# Patient Record
Sex: Male | Born: 1960 | Race: White | Hispanic: No | Marital: Single | Smoking: Never smoker
Health system: Southern US, Community
[De-identification: ages and names within clinical notes are randomized; demographics above are authoritative.]

## PROBLEM LIST (undated history)

## (undated) DIAGNOSIS — I2510 Atherosclerotic heart disease of native coronary artery without angina pectoris: Secondary | ICD-10-CM

## (undated) DIAGNOSIS — E119 Type 2 diabetes mellitus without complications: Secondary | ICD-10-CM

## (undated) HISTORY — PX: CORONARY ANGIOPLASTY WITH STENT PLACEMENT: SHX49

## (undated) HISTORY — PX: APPENDECTOMY: SHX54

---

## 2004-07-09 ENCOUNTER — Emergency Department: Payer: Self-pay | Admitting: Unknown Physician Specialty

## 2004-07-26 ENCOUNTER — Encounter: Payer: Self-pay | Admitting: Internal Medicine

## 2004-08-15 ENCOUNTER — Encounter: Payer: Self-pay | Admitting: Internal Medicine

## 2004-08-16 ENCOUNTER — Ambulatory Visit: Payer: Self-pay | Admitting: Internal Medicine

## 2004-09-15 ENCOUNTER — Encounter: Payer: Self-pay | Admitting: Internal Medicine

## 2004-10-15 ENCOUNTER — Encounter: Payer: Self-pay | Admitting: Internal Medicine

## 2006-07-17 ENCOUNTER — Ambulatory Visit: Payer: Self-pay

## 2006-08-20 ENCOUNTER — Ambulatory Visit: Payer: Self-pay

## 2007-10-03 ENCOUNTER — Ambulatory Visit: Payer: Self-pay

## 2007-10-08 ENCOUNTER — Ambulatory Visit: Payer: Self-pay | Admitting: Cardiology

## 2009-08-30 ENCOUNTER — Ambulatory Visit: Payer: Self-pay | Admitting: Unknown Physician Specialty

## 2011-08-09 ENCOUNTER — Emergency Department: Payer: Self-pay | Admitting: Emergency Medicine

## 2011-08-09 LAB — URINALYSIS, COMPLETE
Bacteria: NONE SEEN
Bilirubin,UR: NEGATIVE
Blood: NEGATIVE
Glucose,UR: NEGATIVE mg/dL (ref 0–75)
Ketone: NEGATIVE
Leukocyte Esterase: NEGATIVE
Nitrite: NEGATIVE
Ph: 6 (ref 4.5–8.0)
RBC,UR: <1 /HPF (ref 0–5)
Specific Gravity: 1.018 (ref 1.003–1.030)
WBC UR: <1 /HPF (ref 0–5)

## 2011-08-09 LAB — COMPREHENSIVE METABOLIC PANEL
Albumin: 3.6 g/dL (ref 3.4–5.0)
BUN: 12 mg/dL (ref 7–18)
Chloride: 104 mmol/L (ref 98–107)
Creatinine: 0.95 mg/dL (ref 0.60–1.30)
EGFR (African American): >60
EGFR (Non-African Amer.): >60
Glucose: 104 mg/dL — ABNORMAL HIGH (ref 65–99)
Osmolality: 274 (ref 275–301)
Potassium: 3.2 mmol/L — ABNORMAL LOW (ref 3.5–5.1)
SGOT(AST): 23 U/L (ref 15–37)
SGPT (ALT): 43 U/L
Total Protein: 7.7 g/dL (ref 6.4–8.2)

## 2011-08-09 LAB — CBC
HGB: 14.3 g/dL (ref 13.0–18.0)
MCH: 25.4 pg — ABNORMAL LOW (ref 26.0–34.0)
MCHC: 33.1 g/dL (ref 32.0–36.0)
Platelet: 250 10*3/uL (ref 150–440)
RBC: 5.63 10*6/uL (ref 4.40–5.90)
WBC: 10.5 10*3/uL (ref 3.8–10.6)

## 2011-08-09 LAB — CK TOTAL AND CKMB (NOT AT ARMC)
CK, Total: 87 U/L (ref 35–232)
CK-MB: 0.9 ng/mL (ref 0.5–3.6)

## 2011-08-10 ENCOUNTER — Emergency Department: Payer: Self-pay | Admitting: Emergency Medicine

## 2011-08-10 LAB — COMPREHENSIVE METABOLIC PANEL
Albumin: 3.6 g/dL (ref 3.4–5.0)
Alkaline Phosphatase: 79 U/L (ref 50–136)
Anion Gap: 9 (ref 7–16)
Calcium, Total: 8.6 mg/dL (ref 8.5–10.1)
EGFR (African American): >60
EGFR (Non-African Amer.): >60
Glucose: 92 mg/dL (ref 65–99)
SGOT(AST): 23 U/L (ref 15–37)
Sodium: 140 mmol/L (ref 136–145)

## 2011-08-10 LAB — CBC
HCT: 42.8 % (ref 40.0–52.0)
MCHC: 33.3 g/dL (ref 32.0–36.0)
MCV: 76 fL — ABNORMAL LOW (ref 80–100)
RBC: 5.61 10*6/uL (ref 4.40–5.90)

## 2011-08-10 LAB — URINALYSIS, COMPLETE
Bacteria: NONE SEEN
Blood: NEGATIVE
Ketone: NEGATIVE
Nitrite: NEGATIVE
Ph: 6 (ref 4.5–8.0)
WBC UR: 1 /HPF (ref 0–5)

## 2011-11-18 ENCOUNTER — Emergency Department: Payer: Self-pay | Admitting: Emergency Medicine

## 2011-11-18 LAB — URINALYSIS, COMPLETE
Bilirubin,UR: NEGATIVE
Blood: NEGATIVE
Glucose,UR: NEGATIVE mg/dL (ref 0–75)
Leukocyte Esterase: NEGATIVE
Nitrite: NEGATIVE
Ph: 6 (ref 4.5–8.0)
Protein: NEGATIVE
Specific Gravity: 1.015 (ref 1.003–1.030)
Squamous Epithelial: NONE SEEN

## 2011-11-18 LAB — COMPREHENSIVE METABOLIC PANEL
Albumin: 3.9 g/dL (ref 3.4–5.0)
Alkaline Phosphatase: 94 U/L (ref 50–136)
Bilirubin,Total: 0.7 mg/dL (ref 0.2–1.0)
Calcium, Total: 8.6 mg/dL (ref 8.5–10.1)
Co2: 24 mmol/L (ref 21–32)
Creatinine: 0.96 mg/dL (ref 0.60–1.30)
EGFR (African American): >60
Glucose: 84 mg/dL (ref 65–99)
Osmolality: 278 (ref 275–301)
SGOT(AST): 32 U/L (ref 15–37)
SGPT (ALT): 44 U/L (ref 12–78)
Total Protein: 8.2 g/dL (ref 6.4–8.2)

## 2011-11-18 LAB — CBC
MCH: 24.5 pg — ABNORMAL LOW (ref 26.0–34.0)
MCHC: 32.9 g/dL (ref 32.0–36.0)
RBC: 5.92 10*6/uL — ABNORMAL HIGH (ref 4.40–5.90)
RDW: 15.4 % — ABNORMAL HIGH (ref 11.5–14.5)

## 2011-11-18 LAB — LIPASE, BLOOD: Lipase: 175 U/L (ref 73–393)

## 2012-01-12 ENCOUNTER — Ambulatory Visit: Payer: Self-pay | Admitting: Gastroenterology

## 2012-08-14 ENCOUNTER — Emergency Department: Payer: Self-pay | Admitting: Emergency Medicine

## 2013-05-20 ENCOUNTER — Ambulatory Visit: Payer: Self-pay | Admitting: Specialist

## 2013-06-09 ENCOUNTER — Emergency Department: Payer: Self-pay | Admitting: Emergency Medicine

## 2013-06-09 LAB — CBC
HCT: 40.7 % (ref 40.0–52.0)
HGB: 13.4 g/dL (ref 13.0–18.0)
MCH: 25.8 pg — ABNORMAL LOW (ref 26.0–34.0)
MCHC: 33 g/dL (ref 32.0–36.0)
MCV: 78 fL — ABNORMAL LOW (ref 80–100)
Platelet: 159 10*3/uL (ref 150–440)
RBC: 5.2 10*6/uL (ref 4.40–5.90)
RDW: 14.6 % — AB (ref 11.5–14.5)
WBC: 4.8 10*3/uL (ref 3.8–10.6)

## 2013-06-09 LAB — BASIC METABOLIC PANEL
Anion Gap: 5 — ABNORMAL LOW (ref 7–16)
BUN: 9 mg/dL (ref 7–18)
CALCIUM: 7.7 mg/dL — AB (ref 8.5–10.1)
Chloride: 107 mmol/L (ref 98–107)
Co2: 25 mmol/L (ref 21–32)
Creatinine: 1.03 mg/dL (ref 0.60–1.30)
EGFR (Non-African Amer.): >60
Glucose: 123 mg/dL — ABNORMAL HIGH (ref 65–99)
Osmolality: 274 (ref 275–301)
POTASSIUM: 3.2 mmol/L — AB (ref 3.5–5.1)
Sodium: 137 mmol/L (ref 136–145)

## 2013-06-09 LAB — TROPONIN I

## 2014-10-08 ENCOUNTER — Ambulatory Visit: Payer: BLUE CROSS/BLUE SHIELD | Attending: Internal Medicine

## 2014-10-08 DIAGNOSIS — G4733 Obstructive sleep apnea (adult) (pediatric): Secondary | ICD-10-CM | POA: Insufficient documentation

## 2014-11-12 ENCOUNTER — Ambulatory Visit: Payer: BLUE CROSS/BLUE SHIELD | Attending: Internal Medicine

## 2014-11-12 DIAGNOSIS — G4733 Obstructive sleep apnea (adult) (pediatric): Secondary | ICD-10-CM | POA: Diagnosis not present

## 2015-07-30 IMAGING — CR ORBITS FOR FOREIGN BODY - 2 VIEW
1 series · 2 of 2 positions shown · non-contrast
Comparison: None.

CLINICAL DATA: Metal working/exposure; clearance prior to MRI

EXAM:
ORBITS FOR FOREIGN BODY - 2 VIEW

[Series 1: w waters pa · 0.14mm/px · 2 of 2 slices shown]
[im 1/2]
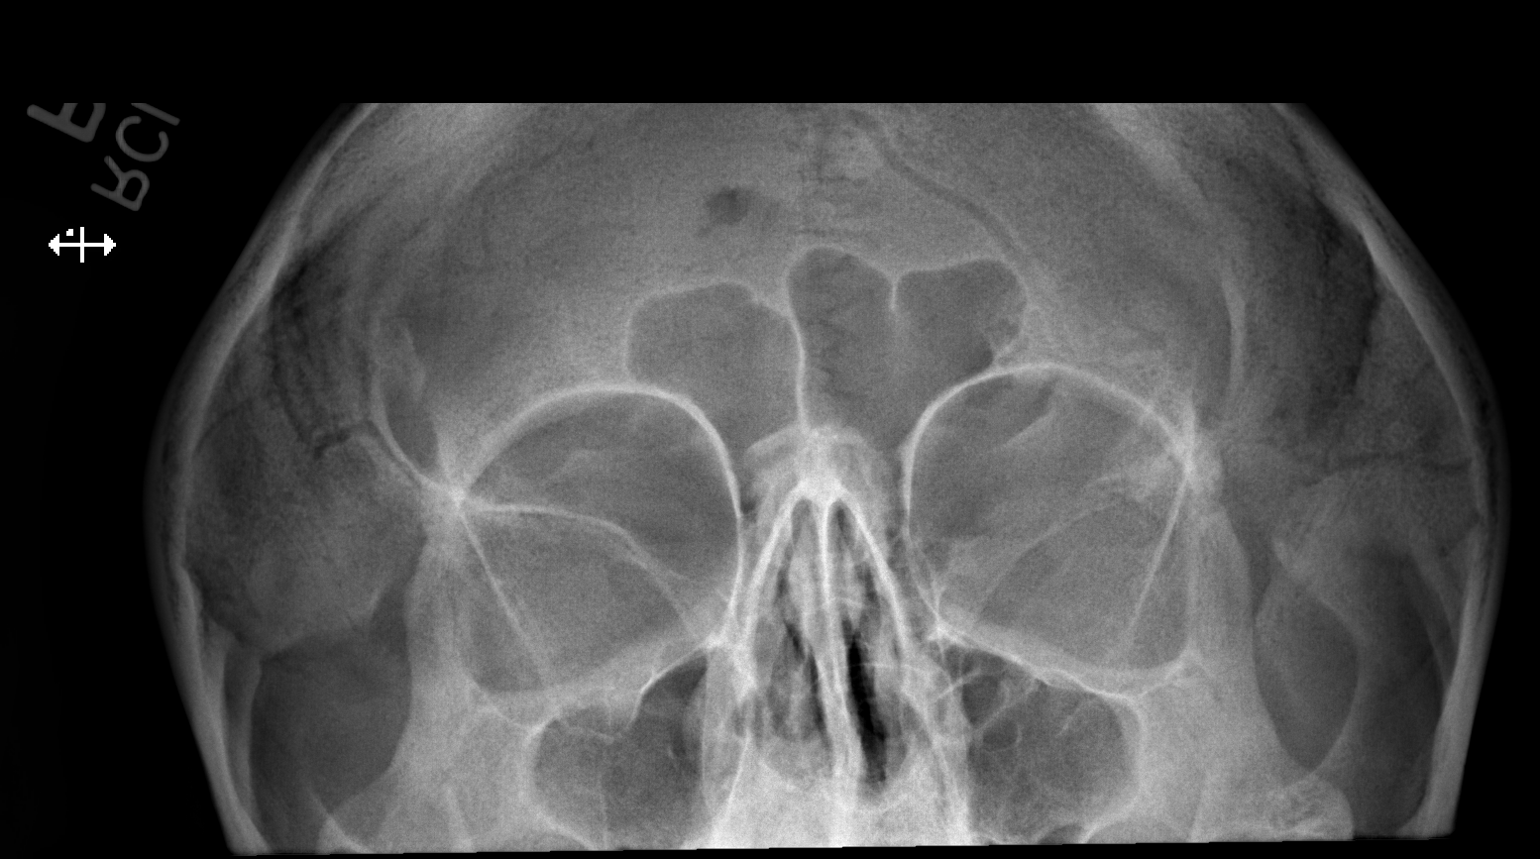
[im 2/2]
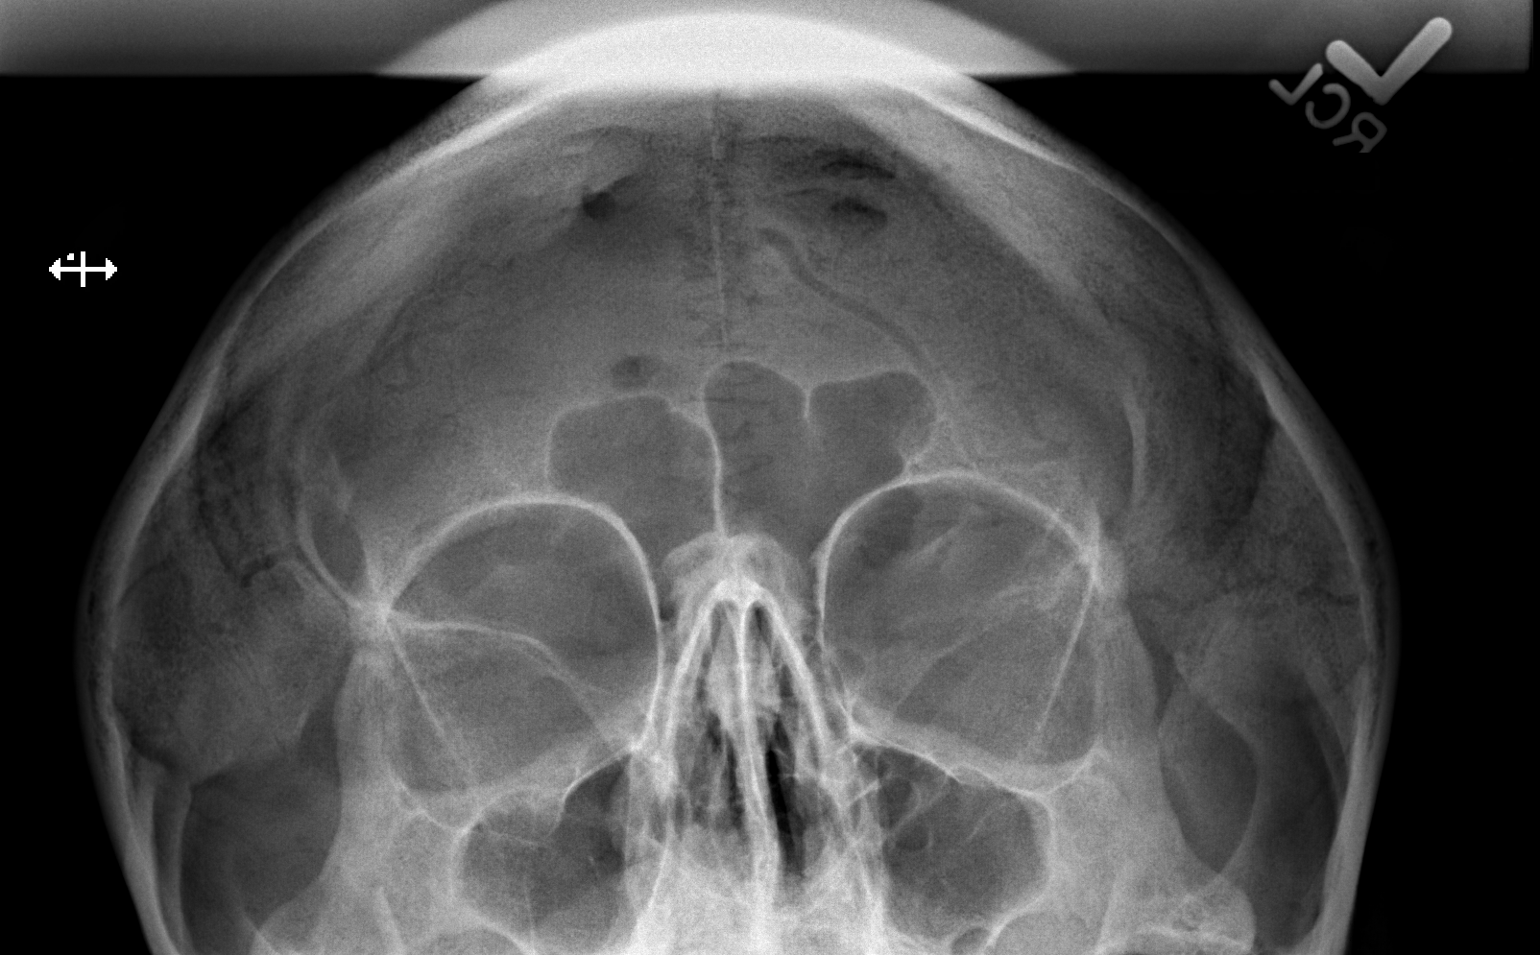

[2 of 2 positions shown; findings below may reference images not displayed]

FINDINGS: There is no evidence of metallic foreign body within the orbits. No
significant bone abnormality identified.
IMPRESSION: No evidence of metallic foreign body within the orbits.

## 2015-12-10 ENCOUNTER — Encounter: Payer: Self-pay | Admitting: *Deleted

## 2015-12-10 ENCOUNTER — Ambulatory Visit
Admission: EM | Admit: 2015-12-10 | Discharge: 2015-12-10 | Disposition: A | Discharge status: Home / Self Care | Payer: Worker's Compensation | Attending: Family Medicine | Admitting: Family Medicine

## 2015-12-10 ENCOUNTER — Ambulatory Visit (INDEPENDENT_AMBULATORY_CARE_PROVIDER_SITE_OTHER): Payer: Worker's Compensation

## 2015-12-10 DIAGNOSIS — S39012A Strain of muscle, fascia and tendon of lower back, initial encounter: Secondary | ICD-10-CM

## 2015-12-10 DIAGNOSIS — M5441 Lumbago with sciatica, right side: Secondary | ICD-10-CM

## 2015-12-10 DIAGNOSIS — M62830 Muscle spasm of back: Secondary | ICD-10-CM

## 2015-12-10 HISTORY — DX: Type 2 diabetes mellitus without complications: E11.9

## 2015-12-10 HISTORY — DX: Atherosclerotic heart disease of native coronary artery without angina pectoris: I25.10

## 2015-12-10 MED ORDER — MELOXICAM 15 MG PO TABS: 15.0000 mg | ORAL_TABLET | Freq: Every day | ORAL | 0 refills | Status: AC

## 2015-12-10 MED ORDER — ORPHENADRINE CITRATE ER 100 MG PO TB12: 100.0000 mg | ORAL_TABLET | Freq: Two times a day (BID) | ORAL | 0 refills | Status: AC

## 2015-12-10 MED ORDER — KETOROLAC TROMETHAMINE 60 MG/2ML IM SOLN
60.0000 mg | Freq: Once | INTRAMUSCULAR | Status: AC
  Administered 2015-12-10: 60 mg via INTRAMUSCULAR

## 2015-12-10 NOTE — ED Provider Notes (Signed)
[facility]    CSN: [id number] Arrival date & time: 12/10/15  1359  First Provider Contact:  First MD Initiated Contact with Patient 12/10/15 1544        History   Chief Complaint Chief Complaint  Patient presents with  . Back Pain    HPI Nathan Owens is a 55 y.o. male.   Patient's here because of back pain. He injured his back on Wednesday. 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 He started feeling some pulling on his back. Yesterday with work has significant mild discomfort told him that he had treat his back at work on Wednesday. Today the pain was much worse and difficult sleeping at night so he came in to be seen and evaluated. He filled out forms yesterday for injury to his back from work and they sent him here for evaluation. 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 Family medical history is not significant. History hypertension hyperlipidemia. He's had appendectomy and coronary angioplasty with stent placement. She is allergic to penicillin and sulfur antibiotics. No pertinent family history significant or relevant to today's visit.   The history is provided by the patient. No language interpreter was used.  Back Pain  Location:  Lumbar spine, gluteal region and sacro-iliac joint Quality:  Burning, aching and cramping Radiates to:  R thigh Pain severity:  Moderate Pain is:  Unable to specify Timing:  Constant Progression:  Worsening Chronicity:  New Context: lifting heavy objects, occupational injury, physical stress and twisting   Context: not emotional stress, not falling, not jumping from heights, not MCA, not MVA, not pedestrian accident, not recent illness and not recent injury   Relieved by:  Nothing Worsened by:  Movement, sitting, twisting and standing Ineffective treatments:  None tried Associated symptoms: no abdominal pain, no abdominal  swelling, no fever and no headaches   Risk factors: lack of exercise, obesity and recent surgery   Risk factors: no hx of cancer, no hx of osteoporosis, no steroid use and no vascular disease     Past Medical History:  Diagnosis Date  . Coronary artery disease   . Diabetes mellitus without complication (HCC)     There are no active problems to display for this patient.   Past Surgical History:  Procedure Laterality Date  . APPENDECTOMY    . CORONARY ANGIOPLASTY WITH STENT PLACEMENT      OB History    No data available       Home Medications    Prior to Admission medications   Medication Sig Start Date End Date Taking? Authorizing Provider  aspirin EC 81 MG tablet Take 81 mg by mouth daily.   Yes Historical Provider, MD  atorvastatin (LIPITOR) 20 MG tablet Take 20 mg by mouth daily.   Yes Historical Provider, MD  clopidogrel (PLAVIX) 75 MG tablet Take 75 mg by mouth daily.   Yes Historical Provider, MD  esomeprazole (NEXIUM) 20 MG packet Take 20 mg by mouth daily before breakfast.   Yes Historical Provider, MD  Liraglutide (VICTOZA [place]) Inject into the skin.   Yes Historical Provider, MD  metoprolol succinate (TOPROL XL) 50 MG 24 hr tablet Take 50 mg by mouth daily. Take with or immediately following a meal.   Yes Historical Provider, MD  ramipril (ALTACE) 10 MG capsule Take 10 mg by mouth daily.   Yes Historical Provider, MD  meloxicam (MOBIC) 15 MG tablet Take 1 tablet (15 mg total) by mouth daily. 12/10/15   Hassan Rowan, MD  orphenadrine (NORFLEX) 100 MG tablet Take 1 tablet (100 mg total) by mouth 2 (two) times daily. 12/10/15   Hassan Rowan, MD    Family History History reviewed. No pertinent family history.  Social History Social History  Substance Use Topics  . Smoking status: Never Smoker  . Smokeless tobacco: Never Used  . Alcohol use No     Allergies   Penicillins and Sulfa antibiotics   Review of Systems Review of Systems  Constitutional: Negative for  fever.  Gastrointestinal: Negative for abdominal pain.  Musculoskeletal: Positive for back pain.  Neurological: Negative for headaches.  All other systems reviewed and are negative.    Physical Exam Triage Vital Signs ED Triage Vitals  Enc Vitals Group     BP 12/10/15 1435 (!) 152/82     Pulse Rate 12/10/15 1435 72     Resp 12/10/15 1435 16     Temp 12/10/15 1435 98.6 F (37 C)     Temp Source 12/10/15 1435 Oral     SpO2 12/10/15 1435 99 %     Weight 12/10/15 1438 275 lb (124.7 kg)     Height 12/10/15 1438 5\' 7"  (1.702 m)     Head Circumference --      Peak Flow --      Pain Score --      Pain Loc --      Pain Edu? --      Excl. in GC? --    No data found.   Updated Vital Signs BP (!) 152/82 (BP Location: Left Arm)   Pulse 72   Temp 98.6 F (37 C) (Oral)   Resp 16   Ht 5\' 7"  (1.702 m)   Wt 275 lb (124.7 kg)   SpO2 99%   BMI 43.07 kg/m   Visual Acuity Right Eye Distance:   Left Eye Distance:   Bilateral Distance:    Right Eye Near:   Left Eye Near:    Bilateral Near:     Physical Exam  Constitutional: He appears well-developed and well-nourished.  HENT:  Head: Normocephalic and atraumatic.  Eyes: EOM are normal. Pupils are equal, round, and reactive to light.  Neck: Normal range of motion. Neck supple.  Pulmonary/Chest: Effort normal.  Abdominal: Soft. There is no tenderness.  Musculoskeletal: Normal range of motion.  Neurological: He is alert. He has normal reflexes. He displays normal reflexes.  Skin: Skin is warm. He is not diaphoretic.  Psychiatric: He has a normal mood and affect.  Vitals reviewed.    UC Treatments / Results  Labs (all labs ordered are listed, but only abnormal results are displayed) Labs Reviewed - No data to display  EKG  EKG Interpretation None       Radiology Dg Lumbar Spine Complete  Result Date: 12/10/2015 CLINICAL DATA:  Right lower lumbar spine pain for 2 days. No known trauma or injury. EXAM: LUMBAR SPINE  - COMPLETE 4+ VIEW COMPARISON:  None. FINDINGS: There is a mild levoscoliosis of the lumbar spine. Alignment otherwise normal. Bone mineralization is normal. No acute fracture line or displaced fracture fragment seen. No acute or suspicious osseous lesion. 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 Minimal disc desiccations and mild degenerative spurring throughout the lumbar spine. No evidence of advanced degenerative change at any level. Upper sacrum appears intact and normal in mineralization. Paravertebral soft tissues are unremarkable.  IMPRESSION: 1. No acute findings. 2. Mild degenerative change, as described above. 3. 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 Electronically Signed   By: Bary RichardStan  Maynard M.D.   On: 12/10/2015 16:26    Procedures Procedures (including critical care time)  Medications Ordered in UC Medications  ketorolac (TORADOL) injection 60 mg (60 mg Intramuscular Given 12/10/15 1603)     Initial Impression / Assessment and Plan / UC Course  I have reviewed the triage vital signs and the nursing notes.  Pertinent labs & imaging results that were available during my care of the patient were reviewed by me and considered in my medical decision making (see chart for details).  Clinical Course    We'll x-ray patient's back 60 Toradol will be given IM and will give patient instructions to follow-up with Edmonia Jamesommie Ann Moore for reevaluation next week. 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 We'll place on Mobic 15 mg 1 tablet day and Norflex one tablet twice a day for muscle spasms.  Final Clinical Impressions(s) / UC Diagnoses   Final diagnoses:  Right-sided low back pain with right-sided sciatica  Low back strain, initial encounter  Muscle spasm of back    New Prescriptions New Prescriptions   MELOXICAM  (MOBIC) 15 MG TABLET    Take 1 tablet (15 mg total) by mouth daily.   ORPHENADRINE (NORFLEX) 100 MG TABLET    Take 1 tablet (100 mg total) by mouth 2 (two) times daily.     Note: This dictation was prepared with Dragon dictation along with smaller phrase technology. Any transcriptional errors that result from this process are unintentional.   Hassan RowanEugene Aneita Kiger, MD 12/10/15 1659

## 2015-12-10 NOTE — ED Triage Notes (Signed)
While at work, pt moving heavy boxes Wednesday had onset of low back pain yesterday which worsened over night.

## 2021-11-09 ENCOUNTER — Ambulatory Visit
Admission: EM | Admit: 2021-11-09 | Discharge: 2021-11-09 | Disposition: A | Discharge status: Home / Self Care | Payer: BLUE CROSS/BLUE SHIELD

## 2021-11-09 ENCOUNTER — Encounter: Payer: Self-pay | Admitting: Emergency Medicine

## 2021-11-09 DIAGNOSIS — H6002 Abscess of left external ear: Secondary | ICD-10-CM

## 2021-11-09 DIAGNOSIS — H9202 Otalgia, left ear: Secondary | ICD-10-CM

## 2021-11-09 MED ORDER — MUPIROCIN 2 % EX OINT: 1.0000 | TOPICAL_OINTMENT | Freq: Three times a day (TID) | CUTANEOUS | 0 refills | Status: AC

## 2021-11-09 MED ORDER — DOXYCYCLINE HYCLATE 100 MG PO CAPS: 100.0000 mg | ORAL_CAPSULE | Freq: Two times a day (BID) | ORAL | 0 refills | Status: AC

## 2021-11-09 NOTE — Discharge Instructions (Signed)
-  You have a pustule inside your canal.  Apply the ointment to the area with use of a Q-tip. - I have also sent oral antibiotics. - You should be feeling a lot better in the next couple of days. - If you feel that the area of swelling has gotten bigger or you have worsening ear pain or fever you need to be seen again and reexamine.

## 2021-11-09 NOTE — ED Provider Notes (Signed)
[facility]    CSN: [id number] Arrival date & time: 11/09/21  1908      History   Chief Complaint No chief complaint on file.   HPI Nathan Owens is a 61 y.o. adult.   HPI  Past Medical History:  Diagnosis Date   Coronary artery disease    Diabetes mellitus without complication (HCC)     There are no problems to display for this patient.   Past Surgical History:  Procedure Laterality Date   APPENDECTOMY     CORONARY ANGIOPLASTY WITH STENT PLACEMENT         Home Medications    Prior to Admission medications   Medication Sig Start Date End Date Taking? Authorizing Provider  aspirin EC 81 MG tablet Take 81 mg by mouth daily.    [provider]  atorvastatin (LIPITOR) 20 MG tablet Take 20 mg by mouth daily.    [provider]  clopidogrel (PLAVIX) 75 MG tablet Take 75 mg by mouth daily.    [provider]  esomeprazole (NEXIUM) 20 MG packet Take 20 mg by mouth daily before breakfast.    [provider]  Liraglutide (VICTOZA [place]) Inject into the skin.    [provider]  meloxicam (MOBIC) 15 MG tablet Take 1 tablet (15 mg total) by mouth daily. 12/10/15   Hassan Rowan, MD  metoprolol succinate (TOPROL XL) 50 MG 24 hr tablet Take 50 mg by mouth daily. Take with or immediately following a meal.    [provider]  orphenadrine (NORFLEX) 100 MG tablet Take 1 tablet (100 mg total) by mouth 2 (two) times daily. 12/10/15   Hassan Rowan, MD  ramipril (ALTACE) 10 MG capsule Take 10 mg by mouth daily.    [provider]    Family History No family history on file.  Social History Social History   Tobacco Use   Smoking status: Never   Smokeless tobacco: Never  Substance Use Topics   Alcohol use: No     Allergies   Penicillins and Sulfa antibiotics   Review of Systems Review of Systems  Constitutional:  Negative for fatigue and fever.  HENT:  Positive for ear pain. Negative for  congestion, ear discharge, rhinorrhea and sinus pain.   Respiratory:  Negative for cough.   Neurological:  Negative for dizziness and headaches.     Physical Exam Triage Vital Signs ED Triage Vitals  Enc Vitals Group     BP      Pulse      Resp      Temp      Temp src      SpO2      Weight      Height      Head Circumference      Peak Flow      Pain Score      Pain Loc      Pain Edu?      Excl. in GC?    No data found.  Updated Vital Signs There were no vitals taken for this visit.    Physical Exam Vitals and nursing note reviewed.  Constitutional:      General: She is not in acute distress.    Appearance: Normal appearance. She is not ill-appearing or toxic-appearing.  HENT:     Head: Normocephalic and atraumatic.     Right Ear: Tympanic membrane, ear canal and external ear normal.     Left Ear: Tympanic membrane, ear canal and  external ear normal.     Nose: Nose normal.     Mouth/Throat:     Mouth: Mucous membranes are moist.     Pharynx: Oropharynx is clear.  Eyes:     General: No scleral icterus.       Right eye: No discharge.        Left eye: No discharge.     Conjunctiva/sclera: Conjunctivae normal.  Cardiovascular:     Rate and Rhythm: Normal rate and regular rhythm.     Heart sounds: Normal heart sounds.  Pulmonary:     Effort: Pulmonary effort is normal. No respiratory distress.     Breath sounds: Normal breath sounds.  Musculoskeletal:     Cervical back: Neck supple.  Skin:    General: Skin is dry.  Neurological:     General: No focal deficit present.     Mental Status: She is alert. Mental status is at baseline.     Motor: No weakness.     Gait: Gait normal.  Psychiatric:        Mood and Affect: Mood normal.        Behavior: Behavior normal.        Thought Content: Thought content normal.      UC Treatments / Results  Labs (all labs ordered are listed, but only abnormal results are displayed) Labs Reviewed - No data to  display  EKG   Radiology No results found.  Procedures Procedures (including critical care time)  Medications Ordered in UC Medications - No data to display  Initial Impression / Assessment and Plan / UC Course  I have reviewed the triage vital signs and the nursing notes.  Pertinent labs & imaging results that were available during my care of the patient were reviewed by me and considered in my medical decision making (see chart for details).     *** Final Clinical Impressions(s) / UC Diagnoses   Final diagnoses:  None   Discharge Instructions   None    ED Prescriptions   None    PDMP not reviewed this encounter.

## 2021-11-09 NOTE — ED Triage Notes (Signed)
Pt presents with left ear pain x 2 days
